# Patient Record
Sex: Male | Born: 2000 | Race: White | Hispanic: No | Marital: Single | Smoking: Never smoker
Health system: Southern US, Community
[De-identification: ages and names within clinical notes are randomized; demographics above are authoritative.]

## PROBLEM LIST (undated history)

## (undated) DIAGNOSIS — Z87820 Personal history of traumatic brain injury: Secondary | ICD-10-CM

## (undated) DIAGNOSIS — R519 Headache, unspecified: Secondary | ICD-10-CM

## (undated) HISTORY — PX: TONSILLECTOMY AND ADENOIDECTOMY: SUR1326

## (undated) HISTORY — DX: Personal history of traumatic brain injury: Z87.820

## (undated) HISTORY — DX: Headache, unspecified: R51.9

---
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
## 2019-05-20 ENCOUNTER — Other Ambulatory Visit: Payer: Self-pay

## 2019-05-20 ENCOUNTER — Encounter: Payer: Self-pay | Admitting: *Deleted

## 2019-05-20 ENCOUNTER — Ambulatory Visit: Payer: Managed Care, Other (non HMO) | Admitting: Diagnostic Neuroimaging

## 2019-05-20 VITALS — BP 125/65 | HR 66 | Temp 97.2°F | Ht 72.0 in | Wt 235.0 lb

## 2019-05-20 DIAGNOSIS — G43109 Migraine with aura, not intractable, without status migrainosus: Secondary | ICD-10-CM

## 2019-05-20 MED ORDER — TOPIRAMATE 50 MG PO TABS: 50.0000 mg | ORAL_TABLET | Freq: Two times a day (BID) | ORAL | 12 refills | Status: DC

## 2019-05-20 MED ORDER — RIZATRIPTAN BENZOATE 10 MG PO TBDP: 10.0000 mg | ORAL_TABLET | ORAL | 11 refills | Status: DC | PRN

## 2019-05-20 NOTE — Patient Instructions (Signed)
MIGRAINE PREVENTION  LIFESTYLE CHANGES -Stop or avoid smoking -Decrease or avoid caffeine / alcohol -Eat and sleep on a regular schedule -Exercise several times per week - start topiramate 50mg  at bedtime; after 1-2 weeks increase to 50mg  twice a day; drink plenty of water  MIGRAINE RESCUE - ibuprofen as needed - rizatriptan (Maxalt) 10mg  as needed for breakthrough headache; may repeat x 1 after 2 hours; max 2 tabs per day or 8 per month

## 2019-05-20 NOTE — Progress Notes (Signed)
[facility]  PATIENT: Adam Durham DOB: May 08, 2000  REFERRING CLINICIAN: Lianne Moris, PA-C HISTORY FROM: patient  REASON FOR VISIT: new consult    HISTORICAL  CHIEF COMPLAINT:  Chief Complaint  Patient presents with  . Headache    rm 6 New Pt, girl friend- Brook "mono in Feb 2021,  forehead, temples headaches every other day since then; Ibuprofen 400 mg as needed knocks it out x 2 hrs but it comes back; have to lay down, may feel like I'll pass out"    HISTORY OF PRESENT ILLNESS:   19 year old male here for evaluation of headaches.  January 2021 patient had onset of scleral icterus and elevated bilirubin, then diagnosed with mononucleosis.  Patient then developed headaches which have continued to present time.  He describes frontal and bitemporal shooting pains, throbbing sensation, photophobia, lightheadedness, seeing spots and sparkles.  No prior similar headaches.  Family history of migraine in mother.  Patient sometimes wakes up with headaches.  Patient has had postcoital/exertional headaches as well.  Patient had MRI and MRA of the head which were unremarkable.  Headaches can last all day.  He is having headaches at least 4-5 times per week.  He has tried ibuprofen with minimal relief for a couple of hours.  Patient is in college and playing baseball, active every day.  He has not been able to take much rest since this has started in January 2021.  He drinks 1-2 caffeine drinks per day.   REVIEW OF SYSTEMS: Full 14 system review of systems performed and negative with exception of: As per HPI.  ALLERGIES: No Known Allergies  HOME MEDICATIONS: Outpatient Medications Prior to Visit  Medication Sig Dispense Refill  . cetirizine (ZYRTEC) 10 MG tablet Take 10 mg by mouth daily.    Marland Kitchen ibuprofen (ADVIL) 200 MG tablet Take 200 mg by mouth every 6 (six) hours as needed.     No facility-administered medications prior to visit.    PAST MEDICAL  HISTORY: Past Medical History:  Diagnosis Date  . H/O multiple concussions    no LOC  . Headache     PAST SURGICAL HISTORY: Past Surgical History:  Procedure Laterality Date  . TONSILLECTOMY AND ADENOIDECTOMY     as child  . WISDOM TOOTH EXTRACTION  2019    FAMILY HISTORY: Family History  Problem Relation Age of Onset  . Migraines Mother   . Hypertension Father   . Diabetes Father   . Auditory processing disorder Sister     SOCIAL HISTORY: Social History   Socioeconomic History  . Marital status: Single    Spouse name: Not on file  . Number of children: Not on file  . Years of education: 40  . Highest education level: Not on file  Occupational History    Comment: student at [employer]  Tobacco Use  . Smoking status: Never Smoker  . Smokeless tobacco: Current User    Types: Snuff  Substance and Sexual Activity  . Alcohol use: Yes    Comment: socially  . Drug use: Not Currently  . Sexual activity: Not on file  Other Topics Concern  . Not on file  Social History Narrative   Caffeine- sweet tea, 2 glasses daily   Social Determinants of Health   Financial Resource Strain:   . Difficulty of Paying Living Expenses:   Food Insecurity:   . Worried About Programme researcher, broadcasting/film/video in the Last Year:   . [employer] of Food in the Last  Year:   Transportation Needs:   . Film/video editor (Medical):   Marland Kitchen Lack of Transportation (Non-Medical):   Physical Activity:   . Days of Exercise per Week:   . Minutes of Exercise per Session:   Stress:   . Feeling of Stress :   Social Connections:   . Frequency of Communication with Friends and Family:   . Frequency of Social Gatherings with Friends and Family:   . Attends Religious Services:   . Active Member of Clubs or Organizations:   . Attends Archivist Meetings:   Marland Kitchen Marital Status:   Intimate Partner Violence:   . Fear of Current or Ex-Partner:   . Emotionally Abused:   Marland Kitchen Physically Abused:   . Sexually Abused:       PHYSICAL EXAM  GENERAL EXAM/CONSTITUTIONAL: Vitals:  Vitals:   05/20/19 0842  BP: 125/65  Pulse: 66  Temp: (!) 97.2 F (36.2 C)  Weight: 235 lb (106.6 kg)  Height: 6' (1.829 m)     Body mass index is 31.87 kg/m. Wt Readings from Last 3 Encounters:  05/20/19 235 lb (106.6 kg) (98 %, Z= 2.15)*   * Growth percentiles are based on CDC (Boys, 2-20 Years) data.     Patient is in no distress; well developed, nourished and groomed; neck is supple  CARDIOVASCULAR:  Examination of carotid arteries is normal; no carotid bruits  Regular rate and rhythm, no murmurs  Examination of peripheral vascular system by observation and palpation is normal  EYES:  Ophthalmoscopic exam of optic discs and posterior segments is normal; no papilledema or hemorrhages  No exam data present  MUSCULOSKELETAL:  Gait, strength, tone, movements noted in Neurologic exam below  NEUROLOGIC: MENTAL STATUS:  No flowsheet data found.  awake, alert, oriented to person, place and time  recent and remote memory intact  normal attention and concentration  language fluent, comprehension intact, naming intact  fund of knowledge appropriate  CRANIAL NERVE:   2nd - no papilledema on fundoscopic exam  2nd, 3rd, 4th, 6th - pupils equal and reactive to light, visual fields full to confrontation, extraocular muscles intact, no nystagmus  5th - facial sensation symmetric  7th - facial strength symmetric  8th - hearing intact  9th - palate elevates symmetrically, uvula midline  11th - shoulder shrug symmetric  12th - tongue protrusion midline  MOTOR:   normal bulk and tone, full strength in the BUE, BLE  SENSORY:   normal and symmetric to light touch, temperature, vibration  COORDINATION:   finger-nose-finger, fine finger movements normal  REFLEXES:   deep tendon reflexes present and symmetric  GAIT/STATION:   narrow based gait     DIAGNOSTIC DATA (LABS, IMAGING,  TESTING) - I reviewed patient records, labs, notes, testing and imaging myself where available.  No results found for: WBC, HGB, HCT, MCV, PLT No results found for: NA, K, CL, CO2, GLUCOSE, BUN, CREATININE, CALCIUM, PROT, ALBUMIN, AST, ALT, ALKPHOS, BILITOT, GFRNONAA, GFRAA No results found for: CHOL, HDL, LDLCALC, LDLDIRECT, TRIG, CHOLHDL No results found for: HGBA1C No results found for: VITAMINB12 No results found for: TSH   03/27/19 MRI / MRA head [report] - unremarkable; no acute findings    ASSESSMENT AND PLAN  19 y.o. year old male here with headaches following infectious mononucleosis, hyperbilirubinemia and scleral icterus.  Headaches have migraine features.  Dx:  1. 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 status migrainosus, not intractable     PLAN:  HEADACHES / (migraine features; some exertional  HA)  MIGRAINE PREVENTION  LIFESTYLE CHANGES -Stop or avoid smoking -Decrease or avoid caffeine / alcohol -Eat and sleep on a regular schedule -Exercise several times per week - start topiramate 50mg  at bedtime; after 1-2 weeks increase to 50mg  twice a day; drink plenty of water  MIGRAINE RESCUE - ibuprofen as needed - rizatriptan (Maxalt) 10mg  as needed for breakthrough headache; may repeat x 1 after 2 hours; max 2 tabs per day or 8 per month - may consider indomethacin in future  Orders Placed This Encounter  Procedures  . CBC with Differential/Platelet  . Comprehensive metabolic panel   Meds ordered this encounter  Medications  . topiramate (TOPAMAX) 50 MG tablet    Sig: Take 1 tablet (50 mg total) by mouth 2 (two) times daily.    Dispense:  60 tablet    Refill:  12  . rizatriptan (MAXALT-MLT) 10 MG disintegrating tablet    Sig: Take 1 tablet (10 mg total) by mouth as needed for migraine. May repeat in 2 hours if needed    Dispense:  9 tablet    Refill:  11   Return in about 6 months (around 11/20/2019).    , MD 05/20/2019, 9:37  AM Certified in Neurology, Neurophysiology and Neuroimaging  [facility] [address] [place], [facility] [address] [phone]

## 2019-05-21 LAB — COMPREHENSIVE METABOLIC PANEL
ALT: 35 IU/L (ref 0–44)
AST: 23 IU/L (ref 0–40)
Albumin/Globulin Ratio: 2 (ref 1.2–2.2)
Albumin: 4.8 g/dL (ref 4.1–5.2)
Alkaline Phosphatase: 93 IU/L (ref 39–117)
BUN/Creatinine Ratio: 15 (ref 9–20)
BUN: 12 mg/dL (ref 6–20)
Bilirubin Total: 1.2 mg/dL (ref 0.0–1.2)
CO2: 25 mmol/L (ref 20–29)
Calcium: 10.1 mg/dL (ref 8.7–10.2)
Chloride: 99 mmol/L (ref 96–106)
Creatinine, Ser: 0.78 mg/dL (ref 0.76–1.27)
GFR calc Af Amer: 151 mL/min/{1.73_m2} (ref >59)
GFR calc non Af Amer: 131 mL/min/{1.73_m2} (ref >59)
Globulin, Total: 2.4 g/dL (ref 1.5–4.5)
Glucose: 89 mg/dL (ref 65–99)
Potassium: 4.9 mmol/L (ref 3.5–5.2)
Sodium: 139 mmol/L (ref 134–144)
Total Protein: 7.2 g/dL (ref 6.0–8.5)

## 2019-05-21 LAB — CBC WITH DIFFERENTIAL/PLATELET
Basophils Absolute: 0 10*3/uL (ref 0.0–0.2)
Basos: 1 %
EOS (ABSOLUTE): 0.1 10*3/uL (ref 0.0–0.4)
Eos: 2 %
Hematocrit: 46.5 % (ref 37.5–51.0)
Hemoglobin: 15.4 g/dL (ref 13.0–17.7)
Immature Grans (Abs): 0 10*3/uL (ref 0.0–0.1)
Immature Granulocytes: 0 %
Lymphocytes Absolute: 2.1 10*3/uL (ref 0.7–3.1)
Lymphs: 44 %
MCH: 30.4 pg (ref 26.6–33.0)
MCHC: 33.1 g/dL (ref 31.5–35.7)
MCV: 92 fL (ref 79–97)
Monocytes Absolute: 0.5 10*3/uL (ref 0.1–0.9)
Monocytes: 10 %
Neutrophils Absolute: 2 10*3/uL (ref 1.4–7.0)
Neutrophils: 43 %
Platelets: 289 10*3/uL (ref 150–450)
RBC: 5.06 x10E6/uL (ref 4.14–5.80)
RDW: 12.4 % (ref 11.6–15.4)
WBC: 4.7 10*3/uL (ref 3.4–10.8)

## 2019-05-25 ENCOUNTER — Encounter: Payer: Self-pay | Admitting: *Deleted

## 2019-11-24 ENCOUNTER — Telehealth: Payer: Self-pay | Admitting: *Deleted

## 2019-11-24 ENCOUNTER — Ambulatory Visit: Payer: Managed Care, Other (non HMO) | Admitting: Diagnostic Neuroimaging

## 2019-11-24 ENCOUNTER — Encounter: Payer: Self-pay | Admitting: Diagnostic Neuroimaging

## 2019-11-24 NOTE — Telephone Encounter (Signed)
Patient was no show for follow up today. 

## 2021-03-03 ENCOUNTER — Other Ambulatory Visit: Payer: Self-pay

## 2021-03-03 ENCOUNTER — Ambulatory Visit
Admission: EM | Admit: 2021-03-03 | Discharge: 2021-03-03 | Disposition: A | Discharge status: Home / Self Care | Payer: BC Managed Care – PPO | Attending: Family Medicine | Admitting: Family Medicine

## 2021-03-03 ENCOUNTER — Encounter: Payer: Self-pay | Admitting: Emergency Medicine

## 2021-03-03 DIAGNOSIS — J029 Acute pharyngitis, unspecified: Secondary | ICD-10-CM | POA: Insufficient documentation

## 2021-03-03 LAB — POCT RAPID STREP A (OFFICE): Rapid Strep A Screen: NEGATIVE

## 2021-03-03 MED ORDER — LIDOCAINE VISCOUS HCL 2 % MT SOLN: 10.0000 mL | OROMUCOSAL | 0 refills | Status: DC | PRN

## 2021-03-03 NOTE — ED Provider Notes (Signed)
[facility]    CSN: [id number] Arrival date & time: 03/03/21  1756      History   Chief Complaint Chief Complaint  Patient presents with   Sore Throat    HPI Adam Durham is a 21 y.o. male.   Presenting today with 1 day history of sore throat.  Denies fever, chills, congestion, cough, chest pain, shortness of breath, abdominal pain, nausea vomiting or diarrhea.  So far not taking anything over-the-counter for symptoms.  Sick family members with similar symptoms.  No known pertinent chronic medical problems.   Past Medical History:  Diagnosis Date   H/O multiple concussions    no LOC   Headache     There are no problems to display for this patient.   Past Surgical History:  Procedure Laterality Date   TONSILLECTOMY AND ADENOIDECTOMY     as child   WISDOM TOOTH EXTRACTION  2019       Home Medications    Prior to Admission medications   Medication Sig Start Date End Date Taking? Authorizing Provider  lidocaine (XYLOCAINE) 2 % solution Use as directed 10 mLs in the mouth or throat every 3 (three) hours as needed for mouth pain. 03/03/21  Yes Particia Nearing, PA-C  cetirizine (ZYRTEC) 10 MG tablet Take 10 mg by mouth daily.    [provider]  ibuprofen (ADVIL) 200 MG tablet Take 200 mg by mouth every 6 (six) hours as needed.    [provider]  rizatriptan (MAXALT-MLT) 10 MG disintegrating tablet Take 1 tablet (10 mg total) by mouth as needed for migraine. May repeat in 2 hours if needed 05/20/19   Penumalli, Glenford Bayley, MD  topiramate (TOPAMAX) 50 MG tablet Take 1 tablet (50 mg total) by mouth 2 (two) times daily. 05/20/19   Penumalli, Glenford Bayley, MD    Family History Family History  Problem Relation Age of Onset   Migraines Mother    Hypertension Father    Diabetes Father    Auditory processing disorder Sister     Social History Social History   Tobacco Use   Smoking status: Never   Smokeless tobacco: Current    Types:  Snuff  Vaping Use   Vaping Use: Every day  Substance Use Topics   Alcohol use: Yes    Comment: socially   Drug use: Not Currently     Allergies   Patient has no known allergies.   Review of Systems Review of Systems Per HPI  Physical Exam Triage Vital Signs ED Triage Vitals [03/03/21 1828]  Enc Vitals Group     BP 134/86     Pulse Rate 81     Resp 18     Temp 97.9 F (36.6 C)     Temp Source Oral     SpO2 95 %     Weight      Height      Head Circumference      Peak Flow      Pain Score 5     Pain Loc      Pain Edu?      Excl. in GC?    No data found.  Updated Vital Signs BP 134/86 (BP Location: Right Arm)    Pulse 81    Temp 97.9 F (36.6 C) (Oral)    Resp 18    SpO2 95%   Visual Acuity Right Eye Distance:   Left Eye Distance:   Bilateral Distance:    Right  Eye Near:   Left Eye Near:    Bilateral Near:     Physical Exam Vitals and nursing note reviewed.  Constitutional:      Appearance: Normal appearance.  HENT:     Head: Atraumatic.     Right Ear: Tympanic membrane normal.     Left Ear: Tympanic membrane normal.     Nose: Nose normal.     Mouth/Throat:     Mouth: Mucous membranes are moist.     Pharynx: Posterior oropharyngeal erythema present. No oropharyngeal exudate.  Eyes:     Extraocular Movements: Extraocular movements intact.     Conjunctiva/sclera: Conjunctivae normal.  Cardiovascular:     Rate and Rhythm: Normal rate and regular rhythm.  Pulmonary:     Effort: Pulmonary effort is normal.     Breath sounds: Normal breath sounds. No wheezing or rales.  Musculoskeletal:        General: Normal range of motion.     Cervical back: Normal range of motion and neck supple.  Skin:    General: Skin is warm and dry.  Neurological:     General: No focal deficit present.     Mental Status: He is oriented to person, place, and time.     Motor: No weakness.     Gait: Gait normal.  Psychiatric:        Mood and Affect: Mood normal.         Thought Content: Thought content normal.        Judgment: Judgment normal.   UC Treatments / Results  Labs (all labs ordered are listed, but only abnormal results are displayed) Labs Reviewed  CULTURE, GROUP A STREP [facility])  POCT RAPID STREP A (OFFICE)    EKG   Radiology No results found.  Procedures Procedures (including critical care time)  Medications Ordered in UC Medications - No data to display  Initial Impression / Assessment and Plan / UC Course  I have reviewed the triage vital signs and the nursing notes.  Pertinent labs & imaging results that were available during my care of the patient were reviewed by me and considered in my medical decision making (see chart for details).     Vital signs reassuring, suspect viral illness.  Rapid strep negative, throat culture pending.  Viscous lidocaine sent for comfort, discussed over-the-counter supportive medications and home care.  Return for acutely worsening symptoms  Final Clinical Impressions(s) / UC Diagnoses   Final diagnoses:  Sore throat   Discharge Instructions   None    ED Prescriptions     Medication Sig Dispense Auth. Provider   lidocaine (XYLOCAINE) 2 % solution Use as directed 10 mLs in the mouth or throat every 3 (three) hours as needed for mouth pain. 100 mL Particia Nearing, [place]      PDMP not reviewed this encounter.   Particia Nearing, [place] 03/03/21 1910

## 2021-03-03 NOTE — ED Triage Notes (Signed)
Sore throat since Thursday

## 2021-03-06 LAB — CULTURE, GROUP A STREP (THRC)

## 2022-01-31 ENCOUNTER — Ambulatory Visit
Admission: RE | Admit: 2022-01-31 | Discharge: 2022-01-31 | Disposition: A | Discharge status: Home / Self Care | Payer: BC Managed Care – PPO | Source: Ambulatory Visit | Attending: Family Medicine | Admitting: Family Medicine

## 2022-01-31 VITALS — BP 126/81 | HR 83 | Temp 98.3°F | Resp 18

## 2022-01-31 DIAGNOSIS — J069 Acute upper respiratory infection, unspecified: Secondary | ICD-10-CM

## 2022-01-31 MED ORDER — PROMETHAZINE-DM 6.25-15 MG/5ML PO SYRP: 5.0000 mL | ORAL_SOLUTION | Freq: Four times a day (QID) | ORAL | 0 refills | Status: DC | PRN

## 2022-01-31 NOTE — Discharge Instructions (Signed)
  Caring for yourself: Get plenty of rest. Drink plenty of fluids, enough so that your urine is light yellow or clear like water. If you have kidney, heart, or liver disease and have to limit fluids, talk with your doctor before you increase the amount of fluids you drink. Take an over-the-counter pain medicine if needed, such as acetaminophen (Tylenol), ibuprofen (Advil, Motrin), or naproxen (Aleve), to relieve fever, headache, and muscle aches. Read and follow all instructions on the label. No one younger than 20 should take aspirin. It has been linked to Reye syndrome, a serious illness. Before you use over the counter cough and cold medicines, check the label. These medicines may not be safe for children younger than age 6 or for people with certain health problems. If the skin around your nose and lips becomes sore, put some petroleum jelly on the area.  Avoid spreading a respiratory virus: Wash your hands regularly, and keep your hands away from your face.  Stay home from school, work, and other public places until you are feeling better and your fever has been gone for at least 24 hours. The fever needs to have gone away on its own without the help of medicine.  

## 2022-01-31 NOTE — ED Triage Notes (Signed)
Pt reports headache, cough, fever, body aches ,congestion, sneezing and runny nose x 3 days. Mucinex and ibuprofen gives some relief.

## 2022-01-31 NOTE — ED Provider Notes (Signed)
  [facility]   [id number] 01/31/22 Arrival Time: 0923  ASSESSMENT & PLAN:  1. Viral URI with cough    Discussed typical duration of likely viral illness. Work note provided. OTC symptom care as needed.  New Prescriptions   PROMETHAZINE-DEXTROMETHORPHAN (PROMETHAZINE-DM) 6.25-15 MG/5ML SYRUP    Take 5 mLs by mouth 4 (four) times daily as needed for cough.     Follow-up Information     Practice, [facility].   Why: As needed. Contact information: [address] [place] [address] [phone]                 Reviewed expectations re: course of current medical issues. Questions answered. Outlined signs and symptoms indicating need for more acute intervention. Understanding verbalized. After Visit Summary given.   SUBJECTIVE: History from: Patient. Adam Durham is a 22 y.o. male. Reports: cough, nasal congestion, mild ST; abrupt onset; x 3 days. Denies: fever and difficulty breathing. Normal PO intake without n/v/d. Sick contacts at work.  OBJECTIVE:  Vitals:   01/31/22 1440  BP: 126/81  Pulse: 83  Resp: 18  Temp: 98.3 F (36.8 C)  TempSrc: Oral  SpO2: 96%    General appearance: alert; no distress Eyes: PERRLA; EOMI; conjunctiva normal HENT: [place]; AT; with nasal congestion Neck: supple  Lungs: speaks full sentences without difficulty; unlabored; CTAB Extremities: no edema Skin: warm and dry Neurologic: normal gait Psychological: alert and cooperative; normal mood and affect   No Known Allergies  Past Medical History:  Diagnosis Date   H/O multiple concussions    no LOC   Headache    Social History   Socioeconomic History   Marital status: Single    Spouse name: Not on file   Number of children: Not on file   Years of education: 12   Highest education level: Not on file  Occupational History    Comment: student at [employer]  Tobacco Use   Smoking status: Never   Smokeless tobacco: Current    Types: Snuff  Vaping Use   Vaping  Use: Every day  Substance and Sexual Activity   Alcohol use: Yes    Comment: socially   Drug use: Not Currently   Sexual activity: Not on file  Other Topics Concern   Not on file  Social History Narrative   Caffeine- sweet tea, 2 glasses daily   Social Determinants of Health   Financial Resource Strain: Not on file  Food Insecurity: Not on file  Transportation Needs: Not on file  Physical Activity: Not on file  Stress: Not on file  Social Connections: Not on file  Intimate Partner Violence: Not on file   Family History  Problem Relation Age of Onset   Migraines Mother    Hypertension Father    Diabetes Father    Auditory processing disorder Sister    Past Surgical History:  Procedure Laterality Date   TONSILLECTOMY AND ADENOIDECTOMY     as child   WISDOM TOOTH EXTRACTION  2019     Vanessa Kick, MD 01/31/22 1539

## 2023-04-08 ENCOUNTER — Telehealth: Admitting: Physician Assistant

## 2023-04-08 DIAGNOSIS — J208 Acute bronchitis due to other specified organisms: Secondary | ICD-10-CM | POA: Diagnosis not present

## 2023-04-08 MED ORDER — PSEUDOEPH-BROMPHEN-DM 30-2-10 MG/5ML PO SYRP: 5.0000 mL | ORAL_SOLUTION | Freq: Four times a day (QID) | ORAL | 0 refills | Status: AC | PRN

## 2023-04-08 MED ORDER — PREDNISONE 20 MG PO TABS: 40.0000 mg | ORAL_TABLET | Freq: Every day | ORAL | 0 refills | Status: AC

## 2023-04-08 MED ORDER — ALBUTEROL SULFATE HFA 108 (90 BASE) MCG/ACT IN AERS: 1.0000 | INHALATION_SPRAY | Freq: Four times a day (QID) | RESPIRATORY_TRACT | 0 refills | Status: AC | PRN

## 2023-04-08 NOTE — Patient Instructions (Signed)
 Adam Durham, thank you for joining Margaretann Loveless, PA-C for today's virtual visit.  While this provider is not your primary care provider (PCP), if your PCP is located in our provider database this encounter information will be shared with them immediately following your visit.   A [facility] MyChart account gives you access to today's visit and all your visits, tests, and labs performed at [facility] " click here if you don't have a [facility] MyChart account or go to [email or web address]  Consent: (Patient) Adam Durham provided verbal consent for this virtual visit at the beginning of the encounter.  Current Medications:  Current Outpatient Medications:    albuterol (VENTOLIN HFA) 108 (90 Base) MCG/ACT inhaler, Inhale 1-2 puffs into the lungs every 6 (six) hours as needed., Disp: 8 g, Rfl: 0   brompheniramine-pseudoephedrine-DM 30-2-10 MG/5ML syrup, Take 5 mLs by mouth 4 (four) times daily as needed., Disp: 120 mL, Rfl: 0   cetirizine (ZYRTEC) 10 MG tablet, Take 10 mg by mouth daily., Disp: , Rfl:    dextromethorphan-guaiFENesin (MUCINEX DM) 30-600 MG 12hr tablet, Take 1 tablet by mouth 2 (two) times daily., Disp: , Rfl:    ibuprofen (ADVIL) 200 MG tablet, Take 200 mg by mouth every 6 (six) hours as needed., Disp: , Rfl:    predniSONE (DELTASONE) 20 MG tablet, Take 2 tablets (40 mg total) by mouth daily with breakfast., Disp: 10 tablet, Rfl: 0   Medications ordered in this encounter:  Meds ordered this encounter  Medications   albuterol (VENTOLIN HFA) 108 (90 Base) MCG/ACT inhaler    Sig: Inhale 1-2 puffs into the lungs every 6 (six) hours as needed.    Dispense:  8 g    Refill:  0    Supervising Provider:   Merrilee Jansky [[id number]]   predniSONE (DELTASONE) 20 MG tablet    Sig: Take 2 tablets (40 mg total) by mouth daily with breakfast.    Dispense:  10 tablet    Refill:  0    Supervising Provider:   Merrilee Jansky [[id number]]    brompheniramine-pseudoephedrine-DM 30-2-10 MG/5ML syrup    Sig: Take 5 mLs by mouth 4 (four) times daily as needed.    Dispense:  120 mL    Refill:  0    Supervising Provider:   Merrilee Jansky [[id number]]     *If you need refills on other medications prior to your next appointment, please contact your pharmacy*  Follow-Up: Call back or seek an in-person evaluation if the symptoms worsen or if the condition fails to improve as anticipated.  [facility] [phone]  Other Instructions Acute Bronchitis, Adult  Acute bronchitis is sudden inflammation of the main airways (bronchi) that come off the windpipe (trachea) in the lungs. The swelling causes the airways to get smaller and make more mucus than normal. This can make it hard to breathe and can cause coughing or noisy breathing (wheezing). Acute bronchitis may last several weeks. The cough may last longer. Allergies, asthma, and exposure to smoke may make the condition worse. What are the causes? This condition can be caused by germs and by substances that irritate the lungs, including: Cold and flu viruses. The most common cause of this condition is the virus that causes the common cold. Bacteria. This is less common. Breathing in substances that irritate the lungs, including: Smoke from cigarettes and other forms of tobacco. Dust and pollen. Fumes from household cleaning products, gases, or burned fuel.  Indoor or outdoor air pollution. What increases the risk? The following factors may make you more likely to develop this condition: A weak body's defense system, also called the immune system. A condition that affects your lungs and breathing, such as asthma. What are the signs or symptoms? Common symptoms of this condition include: Coughing. This may bring up clear, yellow, or green mucus from your lungs (sputum). Wheezing. Runny or stuffy nose. Having too much mucus in your lungs (chest congestion). Shortness  of breath. Aches and pains, including sore throat or chest. How is this diagnosed? This condition is usually diagnosed based on: Your symptoms and medical history. A physical exam. You may also have other tests, including tests to rule out other conditions, such as pneumonia. These tests include: A test of lung function. Test of a mucus sample to look for the presence of bacteria. Tests to check the oxygen level in your blood. Blood tests. Chest X-ray. How is this treated? Most cases of acute bronchitis clear up over time without treatment. Your health care provider may recommend: Drinking more fluids to help thin your mucus so it is easier to cough up. Taking inhaled medicine (inhaler) to improve air flow in and out of your lungs. Using a vaporizer or a humidifier. These are machines that add water to the air to help you breathe better. Taking a medicine that thins mucus and clears congestion (expectorant). Taking a medicine that prevents or stops coughing (cough suppressant). It is not common to take an antibiotic medicine for this condition. Follow these instructions at home:  Take over-the-counter and prescription medicines only as told by your health care provider. Use an inhaler, vaporizer, or humidifier as told by your health care provider. Take two teaspoons (10 mL) of honey at bedtime to lessen coughing at night. Drink enough fluid to keep your urine pale yellow. Do not use any products that contain nicotine or tobacco. These products include cigarettes, chewing tobacco, and vaping devices, such as e-cigarettes. If you need help quitting, ask your health care provider. Get plenty of rest. Return to your normal activities as told by your health care provider. Ask your health care provider what activities are safe for you. Keep all follow-up visits. This is important. How is this prevented? To lower your risk of getting this condition again: Wash your hands often with soap and  water for at least 20 seconds. If soap and water are not available, use hand sanitizer. Avoid contact with people who have cold symptoms. Try not to touch your mouth, nose, or eyes with your hands. Avoid breathing in smoke or chemical fumes. Breathing smoke or chemical fumes will make your condition worse. Get the flu shot every year. Contact a health care provider if: Your symptoms do not improve after 2 weeks. You have trouble coughing up the mucus. Your cough keeps you awake at night. You have a fever. Get help right away if you: Cough up blood. Feel pain in your chest. Have severe shortness of breath. Faint or keep feeling like you are going to faint. Have a severe headache. Have a fever or chills that get worse. These symptoms may represent a serious problem that is an emergency. Do not wait to see if the symptoms will go away. Get medical help right away. Call your local emergency services (911 in the [place] Do not drive yourself to the hospital. Summary Acute bronchitis is inflammation of the main airways (bronchi) that come off the windpipe (trachea) in the lungs.  The swelling causes the airways to get smaller and make more mucus than normal. Drinking more fluids can help thin your mucus so it is easier to cough up. Take over-the-counter and prescription medicines only as told by your health care provider. Do not use any products that contain nicotine or tobacco. These products include cigarettes, chewing tobacco, and vaping devices, such as e-cigarettes. If you need help quitting, ask your health care provider. Contact a health care provider if your symptoms do not improve after 2 weeks. This information is not intended to replace advice given to you by your health care provider. Make sure you discuss any questions you have with your health care provider. Document Revised: 04/06/2021 Document Reviewed: 04/27/2020 Elsevier Patient Education  2024 Elsevier Inc.   If you have been  instructed to have an in-person evaluation today at a local Urgent Care facility, please use the link below. It will take you to a list of all of our available [facility], including address, phone number and hours of operation. Please do not delay care.  [facility]  If you or a family member do not have a primary care provider, use the link below to schedule a visit and establish care. When you choose a [facility] primary care physician or advanced practice provider, you gain a long-term partner in health. Find a Primary Care Provider  Learn more about [facility]'s in-office and virtual care options: [facility] - Get Care Now

## 2023-04-08 NOTE — Progress Notes (Signed)
 Virtual Visit Consent   Adam Durham, you are scheduled for a virtual visit with a [facility] provider today. Just as with appointments in the office, your consent must be obtained to participate. Your consent will be active for this visit and any virtual visit you may have with one of our providers in the next 365 days. If you have a MyChart account, a copy of this consent can be sent to you electronically.  As this is a virtual visit, video technology does not allow for your provider to perform a traditional examination. This may limit your provider's ability to fully assess your condition. If your provider identifies any concerns that need to be evaluated in person or the need to arrange testing (such as labs, EKG, etc.), we will make arrangements to do so. Although advances in technology are sophisticated, we cannot ensure that it will always work on either your end or our end. If the connection with a video visit is poor, the visit may have to be switched to a telephone visit. With either a video or telephone visit, we are not always able to ensure that we have a secure connection.  By engaging in this virtual visit, you consent to the provision of healthcare and authorize for your insurance to be billed (if applicable) for the services provided during this visit. Depending on your insurance coverage, you may receive a charge related to this service.  I need to obtain your verbal consent now. Are you willing to proceed with your visit today? KORION CUEVAS has provided verbal consent on 04/08/2023 for a virtual visit (video or telephone). Margaretann Loveless, PA-C  Date: 04/08/2023 3:24 PM   Virtual Visit via Video Note   I, Margaretann Loveless, connected with  Adam Durham  ([id number], 03/03/2000) on 04/08/23 at  3:15 PM EDT by a video-enabled telemedicine application and verified that I am speaking with the correct person using two identifiers.  Location: Patient: Virtual Visit Location  Patient: Mobile Provider: Virtual Visit Location Provider: Home Office   I discussed the limitations of evaluation and management by telemedicine and the availability of in person appointments. The patient expressed understanding and agreed to proceed.    History of Present Illness: Adam Durham is a 23 y.o. who identifies as a male who was assigned male at birth, and is being seen today for URI symptoms.  HPI: URI  This is a new problem. The current episode started 1 to 4 weeks ago (Seen at [facility] in [place], [place] on 04/04/23 and diagnosed with Viral URI). The problem has been gradually improving. There has been no fever. Associated symptoms include chest pain (from cough), congestion, coughing (dry), headaches and wheezing (mild). Pertinent negatives include no diarrhea, ear pain, nausea, plugged ear sensation, rhinorrhea, sinus pain, sore throat or vomiting. He has tried antihistamine, acetaminophen and NSAIDs (Zyrtec, Mucinex, sudafed) for the symptoms. The treatment provided no relief.   Had been negative for Covid, Flu, RSV, and Strep   Problems: There are no active problems to display for this patient.   Allergies: No Known Allergies Medications:  Current Outpatient Medications:    albuterol (VENTOLIN HFA) 108 (90 Base) MCG/ACT inhaler, Inhale 1-2 puffs into the lungs every 6 (six) hours as needed., Disp: 8 g, Rfl: 0   brompheniramine-pseudoephedrine-DM 30-2-10 MG/5ML syrup, Take 5 mLs by mouth 4 (four) times daily as needed., Disp: 120 mL, Rfl: 0   cetirizine (ZYRTEC) 10 MG tablet, Take 10 mg by mouth  daily., Disp: , Rfl:    dextromethorphan-guaiFENesin (MUCINEX DM) 30-600 MG 12hr tablet, Take 1 tablet by mouth 2 (two) times daily., Disp: , Rfl:    ibuprofen (ADVIL) 200 MG tablet, Take 200 mg by mouth every 6 (six) hours as needed., Disp: , Rfl:    predniSONE (DELTASONE) 20 MG tablet, Take 2 tablets (40 mg total) by mouth daily with breakfast., Disp: 10 tablet, Rfl:  0  Observations/Objective: Patient is well-developed, well-nourished in no acute distress.  Resting comfortably  Head is normocephalic, atraumatic.  No labored breathing.  Speech is clear and coherent with logical content.  Patient is alert and oriented at baseline.    Assessment and Plan: 1. Viral bronchitis (Primary) - albuterol (VENTOLIN HFA) 108 (90 Base) MCG/ACT inhaler; Inhale 1-2 puffs into the lungs every 6 (six) hours as needed.  Dispense: 8 g; Refill: 0 - predniSONE (DELTASONE) 20 MG tablet; Take 2 tablets (40 mg total) by mouth daily with breakfast.  Dispense: 10 tablet; Refill: 0 - brompheniramine-pseudoephedrine-DM 30-2-10 MG/5ML syrup; Take 5 mLs by mouth 4 (four) times daily as needed.  Dispense: 120 mL; Refill: 0  - Worsening over a week despite OTC medications - Will treat with Albuterol, Prednisone, and Bromfed DM - Can continue Mucinex (PLAIN) - Push fluids.  - Rest.  - Steam and humidifier can help - Seek in person evaluation if worsening or symptoms fail to improve    Follow Up Instructions: I discussed the assessment and treatment plan with the patient. The patient was provided an opportunity to ask questions and all were answered. The patient agreed with the plan and demonstrated an understanding of the instructions.  A copy of instructions were sent to the patient via MyChart unless otherwise noted below.    The patient was advised to call back or seek an in-person evaluation if the symptoms worsen or if the condition fails to improve as anticipated.    Margaretann Loveless, PA-C

## 2023-06-16 ENCOUNTER — Other Ambulatory Visit: Payer: Self-pay

## 2023-06-16 DIAGNOSIS — R42 Dizziness and giddiness: Secondary | ICD-10-CM | POA: Insufficient documentation

## 2023-06-16 LAB — URINALYSIS, ROUTINE W REFLEX MICROSCOPIC
Bacteria, UA: NONE SEEN
Bilirubin Urine: NEGATIVE
Glucose, UA: NEGATIVE mg/dL
Hgb urine dipstick: NEGATIVE
Ketones, ur: NEGATIVE mg/dL
Leukocytes,Ua: NEGATIVE
Nitrite: NEGATIVE
Protein, ur: NEGATIVE mg/dL
Specific Gravity, Urine: 1.011 (ref 1.005–1.030)
pH: 6.5 (ref 5.0–8.0)

## 2023-06-16 LAB — CBC
HCT: 42.2 % (ref 39.0–52.0)
Hemoglobin: 14.7 g/dL (ref 13.0–17.0)
MCH: 30.4 pg (ref 26.0–34.0)
MCHC: 34.8 g/dL (ref 30.0–36.0)
MCV: 87.2 fL (ref 80.0–100.0)
Platelets: 274 10*3/uL (ref 150–400)
RBC: 4.84 MIL/uL (ref 4.22–5.81)
RDW: 11.4 % — ABNORMAL LOW (ref 11.5–15.5)
WBC: 6.7 10*3/uL (ref 4.0–10.5)
nRBC: 0 % (ref 0.0–0.2)

## 2023-06-16 LAB — COMPREHENSIVE METABOLIC PANEL WITH GFR
ALT: 35 U/L (ref 0–44)
AST: 23 U/L (ref 15–41)
Albumin: 4.6 g/dL (ref 3.5–5.0)
Alkaline Phosphatase: 77 U/L (ref 38–126)
Anion gap: 15 (ref 5–15)
BUN: 9 mg/dL (ref 6–20)
CO2: 24 mmol/L (ref 22–32)
Calcium: 10.3 mg/dL (ref 8.9–10.3)
Chloride: 100 mmol/L (ref 98–111)
Creatinine, Ser: 0.83 mg/dL (ref 0.61–1.24)
GFR, Estimated: >60 mL/min (ref >60)
Glucose, Bld: 81 mg/dL (ref 70–99)
Potassium: 3.9 mmol/L (ref 3.5–5.1)
Sodium: 139 mmol/L (ref 135–145)
Total Bilirubin: 1.3 mg/dL — ABNORMAL HIGH (ref 0.0–1.2)
Total Protein: 7.5 g/dL (ref 6.5–8.1)

## 2023-06-16 LAB — CBG MONITORING, ED: Glucose-Capillary: 88 mg/dL (ref 70–99)

## 2023-06-16 NOTE — ED Triage Notes (Signed)
 Since Friday feeling fatigued, dizzy, near syncope feelings. Thought it may be attributed to working out in heat Friday, but has continued to hydrate with no relief. Denies CP SOB, -N/-V/-D.

## 2023-06-17 ENCOUNTER — Emergency Department (HOSPITAL_BASED_OUTPATIENT_CLINIC_OR_DEPARTMENT_OTHER)
Admission: EM | Admit: 2023-06-17 | Discharge: 2023-06-17 | Disposition: A | Discharge status: Home / Self Care | Attending: Emergency Medicine | Admitting: Emergency Medicine

## 2023-06-17 DIAGNOSIS — I951 Orthostatic hypotension: Secondary | ICD-10-CM

## 2023-06-17 DIAGNOSIS — R42 Dizziness and giddiness: Secondary | ICD-10-CM

## 2023-06-17 NOTE — ED Provider Notes (Signed)
 [facility] AT [facility] Provider Note  CSN: [id number] Arrival date & time: 06/16/23 2102  Chief Complaint(s) No chief complaint on file.  HPI Adam Durham is a 23 y.o. male with a past medical history listed below who presents to the emergency department for 2 days of lightheadedness and orthostasis.  Patient denies any actual syncopal episodes.  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  Noted the symptoms on Friday and has been trying to hydrate with water and IV hydration powders.  He denies any nausea or vomiting.  No chest pain or shortness of breath.  No palpitations or irregular heart rhythms.  No abdominal pain or diarrhea.  No urinary symptoms.  No recent fevers or infections.  No coughing or congestion.  The history is provided by the patient.    Past Medical History Past Medical History:  Diagnosis Date   H/O multiple concussions    no LOC   Headache    There are no active problems to display for this patient.  Home Medication(s) Prior to Admission medications   Medication Sig Start Date End Date Taking? Authorizing Provider  albuterol  (VENTOLIN  HFA) 108 (90 Base) MCG/ACT inhaler Inhale 1-2 puffs into the lungs every 6 (six) hours as needed. 04/08/23   Angelia Kelp, PA-C  brompheniramine-pseudoephedrine-DM 30-2-10 MG/5ML syrup Take 5 mLs by mouth 4 (four) times daily as needed. 04/08/23   Angelia Kelp, PA-C  cetirizine (ZYRTEC) 10 MG tablet Take 10 mg by mouth daily.    [provider]  dextromethorphan-guaiFENesin (MUCINEX DM) 30-600 MG 12hr tablet Take 1 tablet by mouth 2 (two) times daily.    [provider]  ibuprofen (ADVIL) 200 MG tablet Take 200 mg by mouth every 6 (six) hours as needed.    [provider]  predniSONE  (DELTASONE ) 20 MG tablet Take 2 tablets (40 mg total) by mouth daily with breakfast. 04/08/23   Angelia Kelp, PA-C                                                                                                                                     Allergies Patient has no known allergies.  Review of Systems Review of Systems As noted in HPI  Physical Exam Vital Signs  I have reviewed the triage vital signs BP 128/74   Pulse (!) 56   Temp 98.5 F (36.9 C) (Oral)   Resp 19   SpO2 98%   Physical Exam Vitals reviewed.  Constitutional:      General: He is not in acute distress.    Appearance: He is well-developed. He is not diaphoretic.  HENT:     Head: Normocephalic and atraumatic.     Nose: Nose normal.  Eyes:     General: No scleral icterus.       Right eye: No discharge.  Left eye: No discharge.     Conjunctiva/sclera: Conjunctivae normal.     Pupils: Pupils are equal, round, and reactive to light.  Cardiovascular:     Rate and Rhythm: Normal rate and regular rhythm.     Heart sounds: No murmur heard.    No friction rub. No gallop.  Pulmonary:     Effort: Pulmonary effort is normal. No respiratory distress.     Breath sounds: Normal breath sounds. No stridor. No rales.  Abdominal:     General: There is no distension.     Palpations: Abdomen is soft.     Tenderness: There is no abdominal tenderness.  Musculoskeletal:        General: No tenderness.     Cervical back: Normal range of motion and neck supple.  Skin:    General: Skin is warm and dry.     Findings: No erythema or rash.  Neurological:     Mental Status: He is alert and oriented to person, place, and time.     ED Results and Treatments Labs (all labs ordered are listed, but only abnormal results are displayed) Labs Reviewed  COMPREHENSIVE METABOLIC PANEL WITH GFR - Abnormal; Notable for the following components:      Result Value   Total Bilirubin 1.3 (*)    All other components within normal limits  CBC - Abnormal; Notable for the following components:   RDW 11.4 (*)    All other components within normal limits  URINALYSIS,  ROUTINE W REFLEX MICROSCOPIC  CBG MONITORING, ED                                                                                                                         EKG  EKG Interpretation Date/Time:  Monday June 17 2023 00:47:50 EDT Ventricular Rate:  56 PR Interval:  137 QRS Duration:  105 QT Interval:  434 QTC Calculation: 419 R Axis:   80  Text Interpretation: Sinus rhythm Abnormal inferior Q waves Early repolarization Confirmed by Townsend Freud [phone]) on 06/17/2023 1:11:52 AM       Radiology No results found.  Medications Ordered in ED Medications - No data to display Procedures Procedures  (including critical care time) Medical Decision Making / ED Course   Medical Decision Making Amount and/or Complexity of Data Reviewed Labs: ordered. Decision-making details documented in ED Course. ECG/medicine tests: ordered and independent interpretation performed. Decision-making details documented in ED Course.    Lightheaded/orthostasis  Differential Diagnosis considered  EKG without dysrhythmias or blocks.  No significant interval changes. CBC without anemia No electrolyte derangements or renal sufficiency UA without evidence of infection or concentration. Orthostatic blood pressures reassuring.  Recommended continued oral hydration     Final Clinical Impression(s) / ED Diagnoses Final diagnoses:  Lightheadedness  Orthostasis   The patient appears reasonably screened and/or stabilized for discharge and I doubt any other medical condition or other [facility] requiring further screening, evaluation, or treatment in the ED at this time.  I have discussed the findings, Dx and Tx plan with the patient/family who expressed understanding and agree(s) with the plan. Discharge instructions discussed at length. The patient/family was given strict return precautions who verbalized understanding of the instructions. No further questions at time of discharge.  Disposition:  Discharge  Condition: Good  ED Discharge Orders     None        Follow Up: Practice, [facility] [address] [place] [address] [phone]  Call  to schedule an appointment for close follow up    This chart was dictated using voice recognition software.  Despite best efforts to proofread,  errors can occur which can change the documentation meaning.    Lindle Rhea, MD 06/17/23 [phone]
